# Patient Record
Sex: Male | Born: 1995 | Race: Black or African American | Hispanic: No | Marital: Single | Smoking: Current every day smoker
Health system: Southern US, Community
[De-identification: ages and names within clinical notes are randomized; demographics above are authoritative.]

## PROBLEM LIST (undated history)

## (undated) DIAGNOSIS — J45909 Unspecified asthma, uncomplicated: Secondary | ICD-10-CM

---
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
## 2018-05-16 ENCOUNTER — Emergency Department (HOSPITAL_COMMUNITY)
Admission: EM | Admit: 2018-05-16 | Discharge: 2018-05-16 | Disposition: A | Discharge status: Home / Self Care | Payer: Self-pay | Attending: Emergency Medicine | Admitting: Emergency Medicine

## 2018-05-16 ENCOUNTER — Encounter (HOSPITAL_COMMUNITY): Payer: Self-pay | Admitting: Emergency Medicine

## 2018-05-16 DIAGNOSIS — F17290 Nicotine dependence, other tobacco product, uncomplicated: Secondary | ICD-10-CM | POA: Insufficient documentation

## 2018-05-16 DIAGNOSIS — J45901 Unspecified asthma with (acute) exacerbation: Secondary | ICD-10-CM | POA: Insufficient documentation

## 2018-05-16 MED ORDER — PREDNISONE 20 MG PO TABS
60.0000 mg | ORAL_TABLET | Freq: Once | ORAL | Status: AC
  Administered 2018-05-16: 60 mg via ORAL
  Filled 2018-05-16: qty 3

## 2018-05-16 MED ORDER — ALBUTEROL SULFATE HFA 108 (90 BASE) MCG/ACT IN AERS
2.0000 | INHALATION_SPRAY | Freq: Once | RESPIRATORY_TRACT | Status: AC
  Administered 2018-05-16: 2 via RESPIRATORY_TRACT
  Filled 2018-05-16: qty 6.7

## 2018-05-16 MED ORDER — ALBUTEROL SULFATE (2.5 MG/3ML) 0.083% IN NEBU
2.5000 mg | INHALATION_SOLUTION | Freq: Once | RESPIRATORY_TRACT | Status: AC
  Administered 2018-05-16: 2.5 mg via RESPIRATORY_TRACT
  Filled 2018-05-16: qty 3

## 2018-05-16 MED ORDER — ALBUTEROL SULFATE (2.5 MG/3ML) 0.083% IN NEBU
5.0000 mg | INHALATION_SOLUTION | Freq: Once | RESPIRATORY_TRACT | Status: AC
  Administered 2018-05-16: 5 mg via RESPIRATORY_TRACT
  Filled 2018-05-16: qty 6

## 2018-05-16 MED ORDER — PREDNISONE 20 MG PO TABS: 60.0000 mg | ORAL_TABLET | Freq: Every day | ORAL | 0 refills | Status: AC

## 2018-05-16 NOTE — ED Provider Notes (Signed)
[facility] EMERGENCY DEPARTMENT Provider Note   CSN: [id number] Arrival date & time: 05/16/18  1603     History   Chief Complaint Chief Complaint  Patient presents with  . Asthma    HPI Karl Baker is a 22 y.o. male.  He has a history of asthma with infrequent flares.  He is complaining of a couple of days of increased shortness of breath with chest tightness.  He is hearing some wheezing.  There is been a cough that is been minimally productive.  He started with an nasal congestion few days ago.  No fevers no chills.  He ran out of his inhaler.  He has been on steroids before but last time was over a year ago.  He smokes cigars intermittently.  The history is provided by the patient.  Asthma  This is a recurrent problem. The current episode started 2 days ago. The problem has not changed since onset.Associated symptoms include chest pain and shortness of breath. Pertinent negatives include no abdominal pain and no headaches. Nothing aggravates the symptoms. Nothing relieves the symptoms. He has tried nothing for the symptoms. The treatment provided no relief.    Past Medical History:  Diagnosis Date  . Asthma     There are no active problems to display for this patient.   History reviewed. No pertinent surgical history.      Home Medications    Prior to Admission medications   Medication Sig Start Date End Date Taking? Authorizing Provider  ibuprofen (ADVIL,MOTRIN) 800 MG tablet Take 1 tablet (800 mg total) by mouth every 8 (eight) hours as needed for mild pain or moderate pain. 01/26/15   Antony Madura, PA-C    Family History History reviewed. No pertinent family history.  Social History Social History   Tobacco Use  . Smoking status: Current Every Day Smoker    Types: Cigars  . Smokeless tobacco: Never Used  Substance Use Topics  . Alcohol use: Not on file  . Drug use: Not on file     Allergies   Patient has no known  allergies.   Review of Systems Review of Systems  Constitutional: Negative for fever.  HENT: Negative for sore throat.   Eyes: Negative for visual disturbance.  Respiratory: Positive for shortness of breath.   Cardiovascular: Positive for chest pain.  Gastrointestinal: Negative for abdominal pain, nausea and vomiting.  Genitourinary: Negative for dysuria.  Musculoskeletal: Negative for back pain.  Skin: Negative for rash.  Neurological: Negative for headaches.     Physical Exam Updated Vital Signs BP 131/78 (BP Location: Right Arm)   Pulse 88   Temp (!) 97.5 F (36.4 C) (Oral)   Resp 20   SpO2 97%   Physical Exam  Constitutional: He appears well-developed and well-nourished.  HENT:  Head: Normocephalic and atraumatic.  Eyes: Conjunctivae are normal.  Neck: Neck supple.  Cardiovascular: Normal rate and regular rhythm.  No murmur heard. Pulmonary/Chest: Effort normal. No respiratory distress. He has wheezes (scattered).  Abdominal: Soft. There is no tenderness.  Musculoskeletal: He exhibits no edema, tenderness or deformity.  Neurological: He is alert.  Skin: Skin is warm and dry. Capillary refill takes less than 2 seconds.  Psychiatric: He has a normal mood and affect.  Nursing note and vitals reviewed.    ED Treatments / Results  Labs (all labs ordered are listed, but only abnormal results are displayed) Labs Reviewed - No data to display  EKG None  Radiology No results  found.  Procedures Procedures (including critical care time)  Medications Ordered in ED Medications  predniSONE (DELTASONE) tablet 60 mg (has no administration in time range)  albuterol (PROVENTIL HFA;VENTOLIN HFA) 108 (90 Base) MCG/ACT inhaler 2 puff (has no administration in time range)  albuterol (PROVENTIL) (2.5 MG/3ML) 0.083% nebulizer solution 2.5 mg (has no administration in time range)  albuterol (PROVENTIL) (2.5 MG/3ML) 0.083% nebulizer solution 5 mg (5 mg Nebulization Given  05/16/18 1615)     Initial Impression / Assessment and Plan / ED Course  I have reviewed the triage vital signs and the nursing notes.  Pertinent labs & imaging results that were available during my care of the patient were reviewed by me and considered in my medical decision making (see chart for details).    Reevaluated, improved airation.   Final Clinical Impressions(s) / ED Diagnoses   Final diagnoses:  Mild asthma with exacerbation, unspecified whether persistent    ED Discharge Orders         Ordered    predniSONE (DELTASONE) 20 MG tablet  Daily     05/16/18 1817           Terrilee Files, MD 05/17/18 1008

## 2018-05-16 NOTE — ED Notes (Signed)
ED Provider at bedside. Provider stated no EKG needed.

## 2018-05-16 NOTE — ED Triage Notes (Signed)
Pt presents to ED for assessment of asthma flair up.  Pt is out of his inhaler.  Denies congestion, or cough.

## 2018-05-16 NOTE — Discharge Instructions (Addendum)
Your evaluated in the emergency department for chest tightness consistent with your asthma.  You were given an albuterol inhaler here to use 2 puffs every 4 hours as needed.  We are also prescribing you 4 more days of prednisone.  Please keep well-hydrated.  Please return if any worsening symptoms.

## 2018-05-16 NOTE — ED Notes (Signed)
Due to the continued wheezing will upgrade acuity for further evaluation
